# Patient Record
Sex: Female | Born: 1995 | Race: White | Hispanic: No | Marital: Single | Smoking: Never smoker
Health system: Southern US, Community
[De-identification: ages and names within clinical notes are randomized; demographics above are authoritative.]

## PROBLEM LIST (undated history)

## (undated) DIAGNOSIS — R001 Bradycardia, unspecified: Secondary | ICD-10-CM

## (undated) DIAGNOSIS — I499 Cardiac arrhythmia, unspecified: Secondary | ICD-10-CM

## (undated) HISTORY — PX: HAND SURGERY: SHX662

## (undated) HISTORY — DX: Bradycardia, unspecified: R00.1

## (undated) HISTORY — PX: APPENDECTOMY: SHX54

## (undated) HISTORY — DX: Cardiac arrhythmia, unspecified: I49.9

---
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
## 2018-01-11 DIAGNOSIS — K3580 Unspecified acute appendicitis: Secondary | ICD-10-CM | POA: Insufficient documentation

## 2021-11-29 ENCOUNTER — Ambulatory Visit: Payer: Self-pay | Admitting: Nurse Practitioner

## 2021-11-29 ENCOUNTER — Encounter: Payer: Self-pay | Admitting: Advanced Practice Midwife

## 2021-11-29 DIAGNOSIS — Z1388 Encounter for screening for disorder due to exposure to contaminants: Secondary | ICD-10-CM | POA: Diagnosis not present

## 2021-11-29 DIAGNOSIS — Z0389 Encounter for observation for other suspected diseases and conditions ruled out: Secondary | ICD-10-CM | POA: Diagnosis not present

## 2021-11-29 DIAGNOSIS — Z3009 Encounter for other general counseling and advice on contraception: Secondary | ICD-10-CM | POA: Diagnosis not present

## 2021-11-29 DIAGNOSIS — Z113 Encounter for screening for infections with a predominantly sexual mode of transmission: Secondary | ICD-10-CM | POA: Diagnosis not present

## 2021-11-29 LAB — WET PREP FOR TRICH, YEAST, CLUE
Trichomonas Exam: NEGATIVE
Yeast Exam: NEGATIVE

## 2021-11-29 NOTE — Progress Notes (Signed)
[facility] Department  STI clinic/screening visit [place] [address] [phone]  Subjective:  Tobie A Pichette is a 26 y.o. female being seen today for an STI screening visit. The patient reports they do not have symptoms.  Patient reports that they do not desire a pregnancy in the next year.   They reported they are not interested in discussing contraception today.    Patient's last menstrual period was 11/10/2021 (exact date).   Patient has the following medical conditions:  There are no problems to display for this patient.   Chief Complaint  Patient presents with   SEXUALLY TRANSMITTED DISEASE    HPI  Patient reports to clinic today for STD screening.  Patient reports being asymptomatic.    Last HIV test per patient/review of record was: Never  Patient reports last pap was: Never   Screening for MPX risk: Does the patient have an unexplained rash? No Is the patient MSM? No Does the patient endorse multiple sex partners or anonymous sex partners? No Did the patient have close or sexual contact with a person diagnosed with MPX? No Has the patient traveled outside the [place] where MPX is endemic? No Is there a high clinical suspicion for MPX-- evidenced by one of the following No  -Unlikely to be chickenpox  -Lymphadenopathy  -Rash that present in same phase of evolution on any given body part See flowsheet for further details and programmatic requirements.   Immunization history:  Immunization History  Administered Date(s) Administered   HPV Quadrivalent 08/15/2008, 10/16/2008, 11/04/2009   Hepatitis A 10/13/2005, 04/11/2006   Hepatitis B 1997-08-04, 08/22/1997, 12/30/1997   MMR 09/28/1998, 06/03/2002   Meningococcal Mcv4o 09/28/2012, 12/13/2013   Tdap 08/15/2008   Varicella 06/26/1998, 08/15/2008     The following portions of the patient's history were reviewed and updated as appropriate: allergies, current medications, past  medical history, past social history, past surgical history and problem list.  Objective:  There were no vitals filed for this visit.  Physical Exam Constitutional:      Appearance: Normal appearance.  HENT:     Head: Normocephalic. No abrasion, masses or laceration. Hair is normal.     Right Ear: External ear normal.     Left Ear: External ear normal.     Nose: Nose normal.     Mouth/Throat:     Lips: Pink.     Mouth: Mucous membranes are moist. No oral lesions.     Pharynx: No oropharyngeal exudate or posterior oropharyngeal erythema.     Tonsils: No tonsillar exudate or tonsillar abscesses.     Comments: No visible signs of dental caries  Eyes:     General: Lids are normal.        Right eye: No discharge.        Left eye: No discharge.     Conjunctiva/sclera: Conjunctivae normal.     Right eye: No exudate.    Left eye: No exudate. Abdominal:     General: Abdomen is flat.     Palpations: Abdomen is soft.     Tenderness: There is no abdominal tenderness. There is no rebound.  Genitourinary:    Pubic Area: No rash or pubic lice.      Labia:        Right: No rash, tenderness, lesion or injury.        Left: No rash, tenderness, lesion or injury.      Vagina: Normal. No vaginal discharge, erythema or lesions.  Cervix: No cervical motion tenderness, discharge, lesion or erythema.     Uterus: Not enlarged and not tender.      Rectum: Normal.     Comments: Amount Discharge: small  Odor: No pH: less than 4.5 Adheres to vaginal wall: No Color:  color of discharge matches the white swab  Musculoskeletal:     Cervical back: Full passive range of motion without pain, normal range of motion and neck supple.  Lymphadenopathy:     Cervical: No cervical adenopathy.     Right cervical: No superficial, deep or posterior cervical adenopathy.    Left cervical: No superficial, deep or posterior cervical adenopathy.     Upper Body:     Right upper body: No supraclavicular, axillary  or epitrochlear adenopathy.     Left upper body: No supraclavicular, axillary or epitrochlear adenopathy.     Lower Body: No right inguinal adenopathy. No left inguinal adenopathy.  Skin:    General: Skin is warm and dry.     Findings: No lesion or rash.  Neurological:     Mental Status: She is alert and oriented to person, place, and time.  Psychiatric:        Attention and Perception: Attention normal.        Mood and Affect: Mood normal.        Speech: Speech normal.        Behavior: Behavior normal. Behavior is cooperative.      Assessment and Plan:  Avey A Dethloff is a 26 y.o. female presenting to the [facility] for STI screening  1. Screening examination for venereal disease -26 year old female in clinic today for STD screening. -Patient accepted all screenings including oral GC, vaginal CT/GC, wet prep and declines bloodwork for HIV/RPR.  Patient meets criteria for HepB screening? No. Ordered? No - low risk  Patient meets criteria for HepC screening? Yes. Ordered? No - refused   Treat wet prep per standing order Discussed time line for [facility] results and that patient will be called with positive results and encouraged patient to call if she had not heard in 2 weeks.  Counseled to return or seek care for continued or worsening symptoms Recommended condom use with all sex  Patient is currently not using  contraception   to prevent pregnancy.    - WET PREP FOR TRICH, YEAST, CLUE - Gonococcus culture - Chlamydia/Gonorrhea [facility]   Total time spent: 30 minutes   Return if symptoms worsen or fail to improve.    Ayo White, FNP  

## 2021-11-29 NOTE — Progress Notes (Signed)
Wet mount reviewed with provider during clinic visit - no treatment indicated.   Al Decant, RN

## 2021-12-03 LAB — GONOCOCCUS CULTURE

## 2022-01-27 ENCOUNTER — Ambulatory Visit
Admission: EM | Admit: 2022-01-27 | Discharge: 2022-01-27 | Disposition: A | Discharge status: Home / Self Care | Payer: BC Managed Care – PPO | Attending: Urgent Care | Admitting: Urgent Care

## 2022-01-27 DIAGNOSIS — D692 Other nonthrombocytopenic purpura: Secondary | ICD-10-CM | POA: Insufficient documentation

## 2022-01-27 DIAGNOSIS — D72828 Other elevated white blood cell count: Secondary | ICD-10-CM | POA: Insufficient documentation

## 2022-01-27 DIAGNOSIS — R112 Nausea with vomiting, unspecified: Secondary | ICD-10-CM | POA: Insufficient documentation

## 2022-01-27 LAB — COMPREHENSIVE METABOLIC PANEL
ALT: 14 U/L (ref 0–44)
AST: 13 U/L — ABNORMAL LOW (ref 15–41)
Albumin: 3.9 g/dL (ref 3.5–5.0)
Alkaline Phosphatase: 50 U/L (ref 38–126)
Anion gap: 5 (ref 5–15)
BUN: 9 mg/dL (ref 6–20)
CO2: 26 mmol/L (ref 22–32)
Calcium: 9 mg/dL (ref 8.9–10.3)
Chloride: 106 mmol/L (ref 98–111)
Creatinine, Ser: 0.8 mg/dL (ref 0.44–1.00)
GFR, Estimated: >60 mL/min (ref >60)
Glucose, Bld: 98 mg/dL (ref 70–99)
Potassium: 3.9 mmol/L (ref 3.5–5.1)
Sodium: 137 mmol/L (ref 135–145)
Total Bilirubin: 0.6 mg/dL (ref 0.3–1.2)
Total Protein: 8 g/dL (ref 6.5–8.1)

## 2022-01-27 LAB — URINALYSIS, ROUTINE W REFLEX MICROSCOPIC
Bilirubin Urine: NEGATIVE
Glucose, UA: NEGATIVE mg/dL
Hgb urine dipstick: NEGATIVE
Ketones, ur: NEGATIVE mg/dL
Leukocytes,Ua: NEGATIVE
Nitrite: NEGATIVE
Protein, ur: NEGATIVE mg/dL
Specific Gravity, Urine: 1.02 (ref 1.005–1.030)
pH: 6.5 (ref 5.0–8.0)

## 2022-01-27 LAB — CBC WITH DIFFERENTIAL/PLATELET
Abs Immature Granulocytes: 0.04 10*3/uL (ref 0.00–0.07)
Basophils Absolute: 0.1 10*3/uL (ref 0.0–0.1)
Basophils Relative: 1 %
Eosinophils Absolute: 0.5 10*3/uL (ref 0.0–0.5)
Eosinophils Relative: 4 %
HCT: 40.5 % (ref 36.0–46.0)
Hemoglobin: 13.9 g/dL (ref 12.0–15.0)
Immature Granulocytes: 0 %
Lymphocytes Relative: 22 %
Lymphs Abs: 2.6 10*3/uL (ref 0.7–4.0)
MCH: 32.3 pg (ref 26.0–34.0)
MCHC: 34.3 g/dL (ref 30.0–36.0)
MCV: 94.2 fL (ref 80.0–100.0)
Monocytes Absolute: 0.7 10*3/uL (ref 0.1–1.0)
Monocytes Relative: 6 %
Neutro Abs: 7.7 10*3/uL (ref 1.7–7.7)
Neutrophils Relative %: 67 %
Platelets: 417 10*3/uL — ABNORMAL HIGH (ref 150–400)
RBC: 4.3 MIL/uL (ref 3.87–5.11)
RDW: 12.2 % (ref 11.5–15.5)
WBC: 11.6 10*3/uL — ABNORMAL HIGH (ref 4.0–10.5)
nRBC: 0 % (ref 0.0–0.2)

## 2022-01-27 MED ORDER — PREDNISONE 10 MG (21) PO TBPK: ORAL_TABLET | Freq: Every day | ORAL | 0 refills | Status: DC

## 2022-01-27 MED ORDER — ONDANSETRON HCL 4 MG PO TABS: 4.0000 mg | ORAL_TABLET | Freq: Three times a day (TID) | ORAL | 0 refills | Status: DC | PRN

## 2022-01-27 NOTE — ED Provider Notes (Signed)
[facility]    CSN: [id number] Arrival date & time: 01/27/22  1125      History   Chief Complaint Chief Complaint  Patient presents with   Insect Bite    HPI Cindy Cunningham is a 26 y.o. female.   Pleasant 26 year old female presents today due to concerns of a weird rash to her right hip.  States she was in the [place] when it happened.  She is uncertain what caused it.  She is under the impression she was bit by an insect, but cannot be certain.  She did not see the offending agent.  Since Saturday, has had a red blanchable rash to the right hip.  It has not increased in size.  It does not itch, but is intermittently painful.  Starting this morning, she started having some nausea and vomited once.  She denies a fever.  She is currently on doxycycline for a dental infection and has 6 days left.     History reviewed. No pertinent past medical history.  There are no problems to display for this patient.   Past Surgical History:  Procedure Laterality Date   APPENDECTOMY     HAND SURGERY Right     OB History   No obstetric history on file.      Home Medications    Prior to Admission medications   Medication Sig Start Date End Date Taking? Authorizing Provider  doxycycline (VIBRA-TABS) 100 MG tablet Take 100 mg by mouth 2 (two) times daily. 01/25/22  Yes [provider]  ondansetron (ZOFRAN) 4 MG tablet Take 1 tablet (4 mg total) by mouth every 8 (eight) hours as needed for nausea or vomiting. 01/27/22  Yes Rockney Grenz L, PA  predniSONE (STERAPRED UNI-PAK 21 TAB) 10 MG (21) TBPK tablet Take by mouth daily. Take 6 tabs by mouth daily  for 1 days, then 5 tabs for 1 days, then 4 tabs for 1 days, then 3 tabs for 1 days, 2 tabs for 1 days, then 1 tab by mouth daily for 1 days 01/27/22  Yes Taylorann Tkach L, PA    Family History History reviewed. No pertinent family history.  Social History Social History   Tobacco Use   Smoking status: Never     Passive exposure: Never   Smokeless tobacco: Never  Vaping Use   Vaping Use: Every day   Substances: Nicotine, Flavoring  Substance Use Topics   Alcohol use: Yes    Comment: three times a month   Drug use: Yes    Types: Marijuana    Comment: twice a month     Allergies   Ibuprofen   Review of Systems Review of Systems As per HPI  Physical Exam Triage Vital Signs ED Triage Vitals  Enc Vitals Group     BP 01/27/22 1154 108/65     Pulse Rate 01/27/22 1154 64     Resp 01/27/22 1154 18     Temp 01/27/22 1154 98.2 F (36.8 C)     Temp Source 01/27/22 1154 Oral     SpO2 01/27/22 1154 100 %     Weight 01/27/22 1152 140 lb (63.5 kg)     Height 01/27/22 1152 5\' 2"  (1.575 m)     Head Circumference --      Peak Flow --      Pain Score 01/27/22 1152 7     Pain Loc --      Pain Edu? --      Excl.  in GC? --    No data found.  Updated Vital Signs BP 108/65 (BP Location: Left Arm)   Pulse 64   Temp 98.2 F (36.8 C) (Oral)   Resp 18   Ht 5\' 2"  (1.575 m)   Wt 140 lb (63.5 kg)   LMP 01/06/2022   SpO2 100%   BMI 25.61 kg/m   Visual Acuity Right Eye Distance:   Left Eye Distance:   Bilateral Distance:    Right Eye Near:   Left Eye Near:    Bilateral Near:     Physical Exam Vitals and nursing note reviewed. Exam conducted with a chaperone present.  Constitutional:      General: She is not in acute distress.    Appearance: Normal appearance. She is normal weight. She is not ill-appearing or toxic-appearing.  HENT:     Head: Normocephalic and atraumatic.  Cardiovascular:     Rate and Rhythm: Normal rate.  Pulmonary:     Effort: Pulmonary effort is normal. No respiratory distress.  Abdominal:     General: Abdomen is flat. Bowel sounds are normal. There is no distension.     Palpations: Abdomen is soft. There is no mass.     Tenderness: There is no abdominal tenderness. There is no right CVA tenderness, left CVA tenderness, guarding or rebound.     Hernia: No  hernia is present.  Skin:    General: Skin is warm and dry.     Capillary Refill: Capillary refill takes less than 2 seconds.     Coloration: Skin is not jaundiced.     Findings: Erythema (see photo - large purpuric area to R lateral buttock, painful to touch, blanchable, mildly raised) present.  Neurological:     General: No focal deficit present.     Mental Status: She is alert and oriented to person, place, and time.     Sensory: No sensory deficit.     Motor: No weakness.      UC Treatments / Results  Labs (all labs ordered are listed, but only abnormal results are displayed) Labs Reviewed  CBC WITH DIFFERENTIAL/PLATELET - Abnormal; Notable for the following components:      Result Value   WBC 11.6 (*)    Platelets 417 (*)    All other components within normal limits  COMPREHENSIVE METABOLIC PANEL - Abnormal; Notable for the following components:   AST 13 (*)    All other components within normal limits  URINALYSIS, ROUTINE W REFLEX MICROSCOPIC    EKG   Radiology No results found.  Procedures Procedures (including critical care time)  Medications Ordered in UC Medications - No data to display  Initial Impression / Assessment and Plan / UC Course  I have reviewed the triage vital signs and the nursing notes.  Pertinent labs & imaging results that were available during my care of the patient were reviewed by me and considered in my medical decision making (see chart for details).     Purpura -patient only has 1 single area of purpura, given her GI symptoms Henoch-Schnlein purpura is a possibility, however patient does not have any rash to distal extremities.  GI symptoms x 1 day.  UA obtained to rule out proteinuria and hematuria in the urine.  UA negative.  CBC also obtained, does not show thrombocytopenia.  Cause unknown at this time, will start prednisone x 6 days.  Strict ER precautions reviewed with patient. Leukocytosis -likely secondary to patient's  concurrent dental infection.  She  remains on doxycycline. Nausea and vomiting -benign abdominal exam in office today.  Discussed with patient this could be secondary to #1, may also however be a completely separate entity.  Will give patient Zofran for symptomatic support.  Patient understands should symptoms persist or worsen, head To the emergency room.   Final Clinical Impressions(s) / UC Diagnoses   Final diagnoses:  Purpura (HCC)  Other elevated white blood cell (WBC) count  Nausea and vomiting, unspecified vomiting type     Discharge Instructions      Your rash is called purpura, which is basically related to blood under the skin from a damaged blood vessel. There are many things that can cause this. Your urine is normal. You have a slightly elevated white blood cell count which is likely related to your dental infection. Please monitor for any new purpuric areas. Take the prednisone as prescribed today. If you continue to have nausea or vomiting, take the zofran as prescribed. Please establish care with a PCP or return to our office for a follow-up if this rash persists greater then 1 to 2 weeks. If you develop any new or worsening symptoms return to clinic sooner or head to the emergency room.     ED Prescriptions     Medication Sig Dispense Auth. Provider   predniSONE (STERAPRED UNI-PAK 21 TAB) 10 MG (21) TBPK tablet Take by mouth daily. Take 6 tabs by mouth daily  for 1 days, then 5 tabs for 1 days, then 4 tabs for 1 days, then 3 tabs for 1 days, 2 tabs for 1 days, then 1 tab by mouth daily for 1 days 21 tablet Kratos Ruscitti L, PA   ondansetron (ZOFRAN) 4 MG tablet Take 1 tablet (4 mg total) by mouth every 8 (eight) hours as needed for nausea or vomiting. 20 tablet Heydi Swango L, [place]      PDMP not reviewed this encounter.   Maretta Bees, [place] 01/27/22 1300

## 2022-01-27 NOTE — Discharge Instructions (Addendum)
Your rash is called purpura, which is basically related to blood under the skin from a damaged blood vessel. There are many things that can cause this. Your urine is normal. You have a slightly elevated white blood cell count which is likely related to your dental infection. Please monitor for any new purpuric areas. Take the prednisone as prescribed today. If you continue to have nausea or vomiting, take the zofran as prescribed. Please establish care with a PCP or return to our office for a follow-up if this rash persists greater then 1 to 2 weeks. If you develop any new or worsening symptoms return to clinic sooner or head to the emergency room.

## 2022-01-27 NOTE — ED Triage Notes (Signed)
Pt was seen at the dentist and was given doxycycline and has 6 days left

## 2022-01-27 NOTE — ED Triage Notes (Signed)
Pt is with her best friend  Pt recently returned from a cruise on Monday from the [place] and now has a rash along the right hip.   Pt states that there was a blister along the top of the rash.  Pt believes it was a brown recluse bite and is now experiencing pain and tenderness.  Pt states that when she first noticed the bite on Saturday she had cold chills and nausea.   Pt outlined it last night and states that it hasn't grown in size since last night.

## 2022-07-30 DIAGNOSIS — Z419 Encounter for procedure for purposes other than remedying health state, unspecified: Secondary | ICD-10-CM | POA: Diagnosis not present

## 2022-08-29 DIAGNOSIS — Z419 Encounter for procedure for purposes other than remedying health state, unspecified: Secondary | ICD-10-CM | POA: Diagnosis not present

## 2022-09-29 DIAGNOSIS — Z419 Encounter for procedure for purposes other than remedying health state, unspecified: Secondary | ICD-10-CM | POA: Diagnosis not present

## 2022-10-30 DIAGNOSIS — Z419 Encounter for procedure for purposes other than remedying health state, unspecified: Secondary | ICD-10-CM | POA: Diagnosis not present

## 2022-11-29 ENCOUNTER — Ambulatory Visit
Admission: EM | Admit: 2022-11-29 | Discharge: 2022-11-29 | Disposition: A | Discharge status: Home / Self Care | Payer: BC Managed Care – PPO | Attending: Emergency Medicine | Admitting: Emergency Medicine

## 2022-11-29 DIAGNOSIS — R55 Syncope and collapse: Secondary | ICD-10-CM | POA: Diagnosis not present

## 2022-11-29 DIAGNOSIS — R531 Weakness: Secondary | ICD-10-CM | POA: Insufficient documentation

## 2022-11-29 DIAGNOSIS — Z419 Encounter for procedure for purposes other than remedying health state, unspecified: Secondary | ICD-10-CM | POA: Diagnosis not present

## 2022-11-29 LAB — CBC WITH DIFFERENTIAL/PLATELET
Abs Immature Granulocytes: 0.03 10*3/uL (ref 0.00–0.07)
Basophils Absolute: 0.1 10*3/uL (ref 0.0–0.1)
Basophils Relative: 1 %
Eosinophils Absolute: 0.3 10*3/uL (ref 0.0–0.5)
Eosinophils Relative: 3 %
HCT: 40.7 % (ref 36.0–46.0)
Hemoglobin: 14 g/dL (ref 12.0–15.0)
Immature Granulocytes: 0 %
Lymphocytes Relative: 27 %
Lymphs Abs: 2.4 10*3/uL (ref 0.7–4.0)
MCH: 32.8 pg (ref 26.0–34.0)
MCHC: 34.4 g/dL (ref 30.0–36.0)
MCV: 95.3 fL (ref 80.0–100.0)
Monocytes Absolute: 0.4 10*3/uL (ref 0.1–1.0)
Monocytes Relative: 5 %
Neutro Abs: 5.8 10*3/uL (ref 1.7–7.7)
Neutrophils Relative %: 64 %
Platelets: 302 10*3/uL (ref 150–400)
RBC: 4.27 MIL/uL (ref 3.87–5.11)
RDW: 12.3 % (ref 11.5–15.5)
WBC: 8.9 10*3/uL (ref 4.0–10.5)
nRBC: 0 % (ref 0.0–0.2)

## 2022-11-29 LAB — COMPREHENSIVE METABOLIC PANEL
ALT: 17 U/L (ref 0–44)
AST: 15 U/L (ref 15–41)
Albumin: 4.2 g/dL (ref 3.5–5.0)
Alkaline Phosphatase: 41 U/L (ref 38–126)
Anion gap: 6 (ref 5–15)
BUN: 9 mg/dL (ref 6–20)
CO2: 27 mmol/L (ref 22–32)
Calcium: 8.7 mg/dL — ABNORMAL LOW (ref 8.9–10.3)
Chloride: 104 mmol/L (ref 98–111)
Creatinine, Ser: 0.76 mg/dL (ref 0.44–1.00)
GFR, Estimated: >60 mL/min (ref >60)
Glucose, Bld: 95 mg/dL (ref 70–99)
Potassium: 3.8 mmol/L (ref 3.5–5.1)
Sodium: 137 mmol/L (ref 135–145)
Total Bilirubin: 0.6 mg/dL (ref 0.3–1.2)
Total Protein: 7.7 g/dL (ref 6.5–8.1)

## 2022-11-29 NOTE — ED Provider Notes (Signed)
[facility]    CSN: [id number] Arrival date & time: 11/29/22  1116      History   Chief Complaint Chief Complaint  Patient presents with   Weakness   Loss of Consciousness    HPI Cindy Cunningham is a 27 y.o. female.   HPI  27 year old female with no significant past medical history presents for evaluation of feeling weak and reports a syncopal event yesterday.  She reports that she started her menstrual cycle yesterday and typically she has a loss of uterine cramping and nausea associated with her menses.  She also indicates that the flow is heavy and she does pass clots.  She changes her pad approximately 6 times a day.  Yesterday morning her menstrual cycle started at around 10 AM.  She was driving her roommate to work and around 4, reports that she had not eaten anything, and reports that she began to feel nauseous.  She pulled over into a parking lot and had several episodes of dry heaving but no actual vomiting.  She began feeling weak and was able to make it back to her car.  Her mom came and found her pale and diaphoretic.  EMS was called but did not transport her to the ER.  She went via POV with her mother but left before being seen after triage.  She has not had any more syncopal or presyncopal events but she does endorse feeling weak.  History reviewed. No pertinent past medical history.  There are no problems to display for this patient.   Past Surgical History:  Procedure Laterality Date   APPENDECTOMY     HAND SURGERY Right     OB History   No obstetric history on file.      Home Medications    Prior to Admission medications   Not on File    Family History History reviewed. No pertinent family history.  Social History Social History   Tobacco Use   Smoking status: Never    Passive exposure: Never   Smokeless tobacco: Never  Vaping Use   Vaping status: Every Day   Substances: Nicotine, Flavoring  Substance Use Topics   Alcohol  use: Yes    Comment: three times a month   Drug use: Yes    Types: Marijuana    Comment: twice a month     Allergies   Ibuprofen   Review of Systems Review of Systems  Gastrointestinal:  Positive for abdominal pain and nausea. Negative for vomiting.  Neurological:  Positive for syncope and weakness.     Physical Exam Triage Vital Signs ED Triage Vitals [11/29/22 1139]  Encounter Vitals Group     BP      Systolic BP Percentile      Diastolic BP Percentile      Pulse      Resp 16     Temp      Temp Source Oral     SpO2      Weight      Height      Head Circumference      Peak Flow      Pain Score      Pain Loc      Pain Education      Exclude from Growth Chart    No data found.  Updated Vital Signs BP 112/70 (BP Location: Left Arm)   Pulse 66   Temp 98.9 F (37.2 C) (Oral)   Resp 16   Ht  5\' 3"  (1.6 m)   Wt 160 lb (72.6 kg)   LMP 11/28/2022 (Exact Date)   SpO2 96%   BMI 28.34 kg/m   Visual Acuity Right Eye Distance:   Left Eye Distance:   Bilateral Distance:    Right Eye Near:   Left Eye Near:    Bilateral Near:     Physical Exam Vitals and nursing note reviewed.  Constitutional:      Appearance: Normal appearance. She is not ill-appearing.  HENT:     Head: Normocephalic and atraumatic.  Eyes:     Extraocular Movements: Extraocular movements intact.     Pupils: Pupils are equal, round, and reactive to light.     Comments: Bilateral conjunctiva are pale.  Cardiovascular:     Rate and Rhythm: Normal rate and regular rhythm.     Pulses: Normal pulses.     Heart sounds: Normal heart sounds. No murmur heard.    No friction rub. No gallop.  Pulmonary:     Effort: Pulmonary effort is normal.     Breath sounds: Normal breath sounds. No wheezing, rhonchi or rales.  Skin:    General: Skin is warm and dry.     Capillary Refill: Capillary refill takes less than 2 seconds.  Neurological:     General: No focal deficit present.     Mental Status:  She is alert and oriented to person, place, and time.      UC Treatments / Results  Labs (all labs ordered are listed, but only abnormal results are displayed) Labs Reviewed  COMPREHENSIVE METABOLIC PANEL - Abnormal; Notable for the following components:      Result Value   Calcium 8.7 (*)    All other components within normal limits  CBC WITH DIFFERENTIAL/PLATELET    EKG Sinus bradycardia with sinus arrhythmia Ventricular rate 59 bpm PR interval 170 ms QRS duration 76 ms QT/QTc 402/397 ms There is a flattening of the ST segment in V1 and V2.  No other ST or T wave abnormalities noted.  Radiology No results found.  Procedures Procedures (including critical care time)  Medications Ordered in UC Medications - No data to display  Initial Impression / Assessment and Plan / UC Course  I have reviewed the triage vital signs and the nursing notes.  Pertinent labs & imaging results that were available during my care of the patient were reviewed by me and considered in my medical decision making (see chart for details).   Patient is a nontoxic-appearing 27 year old female presenting for evaluation of ongoing weakness in the setting of a recent syncopal episode.  She denies having syncope in the past.  She states that she is experiencing some uterine cramping, which is not atypical for menstrual cycle.  She started her menstrual cycle yesterday.  She states that she is having a normal flow and is changing her pad approximately 6 times a day.  She is passing some clots but not more than usual.  In addition to starting her menstrual cycle yesterday she reports that she did not eat anything up and to the point where she was taking her friend to work and around 4 PM.  During her ride to work she began feeling nauseous and had an episode of dry heaving but no actual vomiting.  She was evaluated by EMS and cleared to go to the ER via POV but left before being seen.  Her physical exam today  reveals mildly pale conjunctiva bilaterally.  The patient skin is  warm and dry.  Cardiopulmonary exam reveals S1-S2 heart sounds with regular rate and rhythm and lung sounds are clear to auscultation in all fields.  Differential diagnoses include pain response, vasovagal syncope, arrhythmia, hypoglycemia, or anemia.  I will order a CBC, CMP, and EKG.  Patient's EKG shows sinus bradycardia with sinus arrhythmia.  There is a flattening of the ST segment in V1 and V2 without any other T wave or ST abnormalities noted.  No additional tracings available for comparison in epic.  CBC shows a normal H&H of 14.0 and 40.7.  Platelets are also normal at 302.  CMP shows normal electrolytes, renal function, and transaminases.  Patient has a mildly decreased calcium of 8.7.  The etiology of the patient's syncopal/near syncopal event yesterday is unclear though hypoglycemia and anemia are unlikely given the patient's reassuring blood work.  Given her sinus bradycardia with sinus arrhythmia, which may be a normal variant, it is possible that the syncopal event could be cardiogenic in nature or vasovagal in relation to pain response from starting her menstrual cycle.  In any event, I will refer the patient to cardiology.  I will also have staff set her up with a primary care provider prior to discharge so that she has someone to follow-up with.  I did discuss with the patient that if she has a return of symptoms, as she is getting ready to go to [place] tomorrow for work, that she should call 911 and go to the ER.  Final Clinical Impressions(s) / UC Diagnoses   Final diagnoses:  Near syncope  Weakness     Discharge Instructions      Your blood work was reassuring and did not show any signs of electrolyte abnormality, anemia, or diabetes.  Your EKG did show that you have a slow heart rate.  I have made a referral to cardiology for further evaluation of your symptoms should they continue.  Our staff is also  help to establish a PCP at discharge.  You need to keep your new patient appointment for follow-up and further evaluation for new or continued symptoms.  If you have a recurrence of passing out, develop chest pain, dizziness, blurry vision, or heart racing you need to call 911 and go to the ER.     ED Prescriptions   None    PDMP not reviewed this encounter.   Becky Augusta, NP 11/29/22 1240

## 2022-11-29 NOTE — ED Triage Notes (Signed)
Pt c/o weakness since last night. States she lost consciousness in her car for about 40 mins before EMS & mom arrived. Went to [facility] but LWBS after triage due to long wait time. Started her period yesterday.

## 2022-11-29 NOTE — Discharge Instructions (Addendum)
Your blood work was reassuring and did not show any signs of electrolyte abnormality, anemia, or diabetes.  Your EKG did show that you have a slow heart rate.  I have made a referral to cardiology for further evaluation of your symptoms should they continue.  Our staff is also help to establish a PCP at discharge.  You need to keep your new patient appointment for follow-up and further evaluation for new or continued symptoms.  If you have a recurrence of passing out, develop chest pain, dizziness, blurry vision, or heart racing you need to call 911 and go to the ER.

## 2022-12-30 DIAGNOSIS — Z419 Encounter for procedure for purposes other than remedying health state, unspecified: Secondary | ICD-10-CM | POA: Diagnosis not present

## 2023-01-13 ENCOUNTER — Ambulatory Visit (INDEPENDENT_AMBULATORY_CARE_PROVIDER_SITE_OTHER): Payer: BC Managed Care – PPO | Admitting: Physician Assistant

## 2023-01-13 ENCOUNTER — Encounter: Payer: Self-pay | Admitting: Physician Assistant

## 2023-01-13 VITALS — BP 98/62 | HR 95 | Ht 63.0 in | Wt 163.0 lb

## 2023-01-13 DIAGNOSIS — R55 Syncope and collapse: Secondary | ICD-10-CM

## 2023-01-13 DIAGNOSIS — R6889 Other general symptoms and signs: Secondary | ICD-10-CM | POA: Diagnosis not present

## 2023-01-13 DIAGNOSIS — I781 Nevus, non-neoplastic: Secondary | ICD-10-CM | POA: Diagnosis not present

## 2023-01-13 DIAGNOSIS — Z3009 Encounter for other general counseling and advice on contraception: Secondary | ICD-10-CM

## 2023-01-13 DIAGNOSIS — R202 Paresthesia of skin: Secondary | ICD-10-CM

## 2023-01-13 NOTE — Progress Notes (Addendum)
Date:  01/13/2023   Name:  Cindy Cunningham   DOB:  Mar 30, 1995   MRN:  [id number]   Chief Complaint: Establish Care, Dizziness (X 2-3 days, When standing for a while gets off balance, when standing up to fast, TV looks like it is shaking when watching tv), Tingling (In arms and legs when sitting on the toilet for to long or when raising hands), and redness (Referral to derm for spots on face )  HPI Cindy Cunningham is a very pleasant 27 year old female new to the practice today to establish care, brings a variety of mild complaints to discuss today.  Near syncope 11/29/2022 after going nearly all day without calories, seen in urgent care and I have reviewed this note.  CBC and CMP normal, EKG with borderline bradycardia and sinus arrhythmia.  No episodes since then.  Sees cardiology next Tuesday, wonders if any additional lab testing needs to be done.  Reports sometimes when she changes position such as from lying down to standing, she gets tunnel vision.  Also says she had one episode while seated that looked like the TV was shaking but blinked a few times and this spontaneously resolved.  Reports cold intolerance.  Always cold, especially in the periphery.  Even in warm settings she often feels cold.  History of thyroid problems in the family.  Endorses tingling sensations of the hands when she puts both hands behind her head lying down.  Tingling sensation in the feet when sitting on the toilet especially for longer periods of time.  Says mom has similar.  Couple red spots on the face present for years, wonders if she needs dermatology referral.  New relationship, wondering about birth control.  At the same time, she declines essentially all forms of birth control.  Generally speaking, her lifestyle is not the best.  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  Does not take a multivitamin.  Does not have many fruits and vegetables in the  diet.  Medication list has been reviewed and updated.  No outpatient medications have been marked as taking for the 01/13/23 encounter (Office Visit) with Remo Lipps, PA.     Review of Systems  Patient Active Problem List   Diagnosis Date Noted   Acute appendicitis 01/11/2018   Trigger finger, acquired 04/30/2013    Allergies  Allergen Reactions   Ibuprofen Swelling    Immunization History  Administered Date(s) Administered   HPV Quadrivalent 08/15/2008, 10/16/2008, 11/04/2009   Hepatitis A 10/13/2005, 04/11/2006   Hepatitis B 08-15-1997, 08/22/1997, 12/30/1997   Influenza,inj,Quad PF,6+ Mos 01/12/2018   MMR 09/28/1998, 06/03/2002   Meningococcal Mcv4o 09/28/2012, 12/13/2013   Tdap 08/15/2008   Varicella 06/26/1998, 08/15/2008    Past Surgical History:  Procedure Laterality Date   APPENDECTOMY     HAND SURGERY Right     Social History   Tobacco Use   Smoking status: Never    Passive exposure: Never   Smokeless tobacco: Never  Vaping Use   Vaping status: Every Day   Substances: Nicotine, Flavoring  Substance Use Topics   Alcohol use: Yes    Comment: three times a month   Drug use: Yes    Types: Marijuana    Comment: twice a month    Family History  Problem Relation Age of Onset   Hypertension Mother    Heart disease Mother    Heart disease Father  Hypertension Maternal Grandmother    Hypertension Maternal Grandfather    Diabetes Maternal Grandfather    Hypertension Paternal Grandmother    Diabetes Paternal Grandmother    Hypertension Paternal Grandfather          No data to display              No data to display          BP Readings from Last 3 Encounters:  01/13/23 98/62  11/29/22 112/70  01/27/22 108/65    Wt Readings from Last 3 Encounters:  01/13/23 163 lb (73.9 kg)  11/29/22 160 lb (72.6 kg)  01/27/22 140 lb (63.5 kg)    BP 98/62   Pulse 95   Ht 5\' 3"  (1.6 m)   Wt 163 lb (73.9 kg)   SpO2 98%   BMI 28.87  kg/m   Physical Exam Vitals and nursing note reviewed.  Constitutional:      Appearance: Normal appearance.  Neck:     Vascular: No carotid bruit.  Cardiovascular:     Rate and Rhythm: Normal rate and regular rhythm.     Heart sounds: No murmur heard.    No friction rub. No gallop.  Pulmonary:     Effort: Pulmonary effort is normal.     Breath sounds: Normal breath sounds.  Abdominal:     General: There is no distension.  Musculoskeletal:        General: Normal range of motion.  Skin:    General: Skin is warm and dry.     Comments: Two tiny spider angiomata of left cheek and nasal bridge  Neurological:     Mental Status: She is alert and oriented to person, place, and time.     Gait: Gait is intact.  Psychiatric:        Mood and Affect: Mood and affect normal.     Recent Labs     Component Value Date/Time   NA 137 11/29/2022 1203   K 3.8 11/29/2022 1203   CL 104 11/29/2022 1203   CO2 27 11/29/2022 1203   GLUCOSE 95 11/29/2022 1203   BUN 9 11/29/2022 1203   CREATININE 0.76 11/29/2022 1203   CALCIUM 8.7 (L) 11/29/2022 1203   PROT 7.7 11/29/2022 1203   ALBUMIN 4.2 11/29/2022 1203   AST 15 11/29/2022 1203   ALT 17 11/29/2022 1203   ALKPHOS 41 11/29/2022 1203   BILITOT 0.6 11/29/2022 1203   GFRNONAA >60 11/29/2022 1203    Lab Results  Component Value Date   WBC 8.9 11/29/2022   HGB 14.0 11/29/2022   HCT 40.7 11/29/2022   MCV 95.3 11/29/2022   PLT 302 11/29/2022   No results found for: "HGBA1C" No results found for: "CHOL", "HDL", "LDLCALC", "LDLDIRECT", "TRIG", "CHOLHDL" No results found for: "TSH"   Assessment and Plan:  1. Near syncope Patient reassured probably hypoglycemia versus general malaise from not eating on the day of the event.  Encouraged at least 2 meals per day and adequate hydration throughout the day.  Perhaps some element of orthostasis, but not clinically apparent.  Blood pressure borderline hypotensive today.  No tachycardia induced  when rising from seated to standing position.  Patient reassured I feel her cardiac workup on Tuesday will be negative.  She would most benefit from improvements to lifestyle including diet, sleep, physical activity.  2. Cold intolerance Check TSH especially with family history of thyroid issues. - TSH  3. Spider angioma Patient reassured of benign nature of these,  no need for further intervention.  Dermatology referral offered if she desires treatment for cosmesis, but deferred at this time.  4. Paresthesia Discussed with patient is relatively normal to experience these sensations in the context that she is describing.  It is possible that there may be some B vitamin deficiencies here contributing to the paresthesia, but management remains the same; encouraged nutritive diet +/- multivitamin  5. General counseling and advice on contraceptive management Reviewed options for contraception including OCPs (combined or POP), IUD, Nexplanon, contraceptive patches.  At this time she would not like any of these options, so we discussed the relatively high effectiveness of calendar planning as a means of contraception when done correctly, as long as her menses are regular (which they are).  Advised barrier method as additional means of contraception.     There appears to be some element of illness anxiety in this patient.  Reassured today.  Needs routine care moving forward.  Will monitor for now.  Patient to return within the month for complete physical exam with Pap (none prior).  Return in about 4 weeks (around 02/10/2023) for CPE w/ pap.    Alvester Morin, PA-C, DMSc, Nutritionist [facility] [phone]

## 2023-01-13 NOTE — Patient Instructions (Signed)
-  It was a pleasure to see you today! Please review your visit summary for helpful information -Lab results are usually available within 1-2 days and we will call once reviewed -I would encourage you to follow your care via MyChart where you can access lab results, notes, messages, and more -If you feel that we did a nice job today, please complete your after-visit survey and leave us a Google review! Your CMA today was Kieandra and your provider was Dan Waddell, PA-C, DMSc -Please return for follow-up in about 1 month  

## 2023-01-17 ENCOUNTER — Ambulatory Visit: Payer: BC Managed Care – PPO

## 2023-01-17 ENCOUNTER — Ambulatory Visit: Payer: BC Managed Care – PPO | Attending: Cardiology | Admitting: Cardiology

## 2023-01-17 ENCOUNTER — Encounter: Payer: Self-pay | Admitting: Cardiology

## 2023-01-17 VITALS — BP 106/70 | HR 52 | Ht 63.0 in | Wt 165.4 lb

## 2023-01-17 DIAGNOSIS — R001 Bradycardia, unspecified: Secondary | ICD-10-CM

## 2023-01-17 DIAGNOSIS — R55 Syncope and collapse: Secondary | ICD-10-CM

## 2023-01-17 NOTE — Patient Instructions (Signed)
Medication Instructions:   Your physician recommends that you continue on your current medications as directed. Please refer to the Current Medication list given to you today.  *If you need a refill on your cardiac medications before your next appointment, please call your pharmacy*   Lab Work:  None Ordered  If you have labs (blood work) drawn today and your tests are completely normal, you will receive your results only by: MyChart Message (if you have MyChart) OR A paper copy in the mail If you have any lab test that is abnormal or we need to change your treatment, we will call you to review the results.   Testing/Procedures:  Your physician has requested that you have an echocardiogram - in 3 weeks or later. Echocardiography is a painless test that uses sound waves to create images of your heart. It provides your doctor with information about the size and shape of your heart and how well your heart's chambers and valves are working. This procedure takes approximately one hour. There are no restrictions for this procedure. Please do NOT wear cologne, perfume, aftershave, or lotions (deodorant is allowed). Please arrive 15 minutes prior to your appointment time.  Please note: We ask at that you not bring children with you during ultrasound (echo/ vascular) testing. Due to room size and safety concerns, children are not allowed in the ultrasound rooms during exams. Our front office staff cannot provide observation of children in our lobby area while testing is being conducted. An adult accompanying a patient to their appointment will only be allowed in the ultrasound room at the discretion of the ultrasound technician under special circumstances. We apologize for any inconvenience.  Your physician has recommended that you wear a Zio monitor.   This monitor is a medical device that records the heart's electrical activity. Doctors most often use these monitors to diagnose arrhythmias.  Arrhythmias are problems with the speed or rhythm of the heartbeat. The monitor is a small device applied to your chest. You can wear one while you do your normal daily activities. While wearing this monitor if you have any symptoms to push the button and record what you felt. Once you have worn this monitor for the period of time provider prescribed (Usually 14 days), you will return the monitor device in the postage paid box. Once it is returned they will download the data collected and provide [place] with a report which the provider will then review and we will call you with those results. Important tips:  Avoid showering during the first 24 hours of wearing the monitor. Avoid excessive sweating to help maximize wear time. Do not submerge the device, no hot tubs, and no swimming pools. Keep any lotions or oils away from the patch. After 24 hours you may shower with the patch on. Take brief showers with your back facing the shower head.  Do not remove patch once it has been placed because that will interrupt data and decrease adhesive wear time. Push the button when you have any symptoms and write down what you were feeling. Once you have completed wearing your monitor, remove and place into box which has postage paid and place in your outgoing mailbox.  If for some reason you have misplaced your box then call our office and we can provide another box and/or mail it off for you.     Follow-Up: At [facility], you and your health needs are our priority.  As part of our continuing mission to  provide you with exceptional heart care, we have created designated Provider Care Teams.  These Care Teams include your primary Cardiologist (physician) and Advanced Practice Providers (APPs -  Physician Assistants and Nurse Practitioners) who all work together to provide you with the care you need, when you need it.  We recommend signing up for the patient portal called "MyChart".  Sign up information is  provided on this After Visit Summary.  MyChart is used to connect with patients for Virtual Visits (Telemedicine).  Patients are able to view lab/test results, encounter notes, upcoming appointments, etc.  Non-urgent messages can be sent to your provider as well.   To learn more about what you can do with MyChart, go to [email or web address].    Your next appointment:   8 - 10 week(s)  Provider:   You may see Debbe Odea, MD or one of the following Advanced Practice Providers on your designated Care Team:   Nicolasa Ducking, NP Eula Listen, PA-C Cadence Fransico Michael, PA-C Charlsie Quest, NP Carlos Levering, NP

## 2023-01-17 NOTE — Progress Notes (Signed)
Cardiology Office Note:    Date:  01/17/2023   ID:  Cindy Cunningham, DOB 01/05/1996, MRN [id number]  PCP:  Remo Lipps, PA   [facility] Providers Cardiologist:  Debbe Odea, MD     Referring MD: Becky Augusta, NP   Chief Complaint  Patient presents with   New Patient (Initial Visit)    Referred for cardiac evaluation of near syncope, sinus bradycardia, and sinus arrhythmia.  Seen in ED on 11/29/22 after near syncopal episode.  No further episodes.      Cindy Cunningham is a 27 y.o. female who is being seen today for the evaluation of dizziness at the request of Becky Augusta, NP.   History of Present Illness:    Cindy Cunningham is a 27 y.o. female with no significant past medical history who presents due to dizziness and presyncope.  States having an episode of dizziness, nausea last month while at the grocery store.  She walked into her car, and called her mom after sitting in the chair.  States having significant pain at the time from her menses.  Did not fully pass out but states she was almost at that point.  She has had previous episodes in the past at least 7 years now.  Symptoms sometimes associated with menses, although can occur randomly.  Mother took patient to ED, workup was unrevealing, sinus bradycardia with sinus arrhythmia noted, heart rate 59.  Was advised to follow-up with cardiology.  Denies chest pain, palpitations, shortness of breath.  Past Medical History:  Diagnosis Date   Bradycardia    Irregular heart beat     Past Surgical History:  Procedure Laterality Date   APPENDECTOMY     HAND SURGERY Right     Current Medications: No outpatient medications have been marked as taking for the 01/17/23 encounter (Office Visit) with Debbe Odea, MD.     Allergies:   Ibuprofen   Social History   Socioeconomic History   Marital status: Single    Spouse name: Not on file   Number of children: 0   Years of education: Not on  file   Highest education level: Not on file  Occupational History   Not on file  Tobacco Use   Smoking status: Never    Passive exposure: Never   Smokeless tobacco: Never  Vaping Use   Vaping status: Every Day   Substances: Nicotine, Flavoring  Substance and Sexual Activity   Alcohol use: Yes    Comment: three times a month   Drug use: Yes    Types: Marijuana    Comment: twice a month   Sexual activity: Yes    Birth control/protection: None  Other Topics Concern   Not on file  Social History Narrative   Not on file   Social Determinants of Health   Financial Resource Strain: Low Risk  (01/11/2018)   Received from [facility], [facility]   Overall Financial Resource Strain (CARDIA)    Difficulty of Paying Living Expenses: Not hard at all  Food Insecurity: Unknown (01/11/2018)   Received from [facility], [facility]   Hunger Vital Sign    Worried About Running Out of Food in the Last Year: Patient declined    Ran Out of Food in the Last Year: Patient declined  Transportation Needs: Unknown (01/11/2018)   Received from [facility], [facility]   [facility] - Transportation    Lack of Transportation (Medical): Patient  declined    Lack of Transportation (Non-Medical): Patient declined  Physical Activity: Not on file  Stress: Not on file  Social Connections: Not on file     Family History: The patient's family history includes Diabetes in her maternal grandfather and paternal grandmother; Heart disease in her father and mother; Hypertension in her maternal grandfather, maternal grandmother, mother, paternal grandfather, and paternal grandmother.  ROS:   Please see the history of present illness.     All other systems reviewed and are negative.  EKGs/Labs/Other Studies Reviewed:    The following studies were reviewed today:  EKG Interpretation Date/Time:  Tuesday January 17 2023 09:21:12 EST Ventricular Rate:  52 PR Interval:  168 QRS  Duration:  80 QT Interval:  406 QTC Calculation: 377 R Axis:   9  Text Interpretation: Sinus bradycardia with sinus arrhythmia Confirmed by Debbe Odea ([id number]) on 01/17/2023 9:30:39 AM    Recent Labs: 11/29/2022: ALT 17; BUN 9; Creatinine, Ser 0.76; Hemoglobin 14.0; Platelets 302; Potassium 3.8; Sodium 137  Recent Lipid Panel No results found for: "CHOL", "TRIG", "HDL", "CHOLHDL", "VLDL", "LDLCALC", "LDLDIRECT"   Risk Assessment/Calculations:             Physical Exam:    VS:  BP 106/70 (BP Location: Right Arm, Patient Position: Sitting, Cuff Size: Normal)   Pulse (!) 52   Ht 5\' 3"  (1.6 m)   Wt 165 lb 6.4 oz (75 kg)   SpO2 99%   BMI 29.30 kg/m     Wt Readings from Last 3 Encounters:  01/17/23 165 lb 6.4 oz (75 kg)  01/13/23 163 lb (73.9 kg)  11/29/22 160 lb (72.6 kg)     GEN:  Well nourished, well developed in no acute distress HEENT: Normal NECK: No JVD; No carotid bruits CARDIAC: RRR, no murmurs, rubs, gallops RESPIRATORY:  Clear to auscultation without rales, wheezing or rhonchi  ABDOMEN: Soft, non-tender, non-distended MUSCULOSKELETAL:  No edema; No deformity  SKIN: Warm and dry NEUROLOGIC:  Alert and oriented x 3 PSYCHIATRIC:  Normal affect   ASSESSMENT:    1. Pre-syncope   2. Sinus bradycardia    PLAN:    In order of problems listed above:  Presyncopal symptoms, appear vasovagal in etiology.  Orthostatic vitals today with no evidence for orthostasis.  Please cardiac monitor to evaluate any significant conduction abnormalities or arrhythmias.  Obtain echocardiogram.  Patient overall is low cardiac risk.  Safety precautions/sitting down  when prodromal symptoms occur where advised. Sinus bradycardia, cardiac monitor as above.  Appears benign.  Follow-up after cardiac testing      Medication Adjustments/Labs and Tests Ordered: Current medicines are reviewed at length with the patient today.  Concerns regarding medicines are outlined above.   Orders Placed This Encounter  Procedures   LONG TERM MONITOR (3-14 DAYS)   EKG 12-Lead   ECHOCARDIOGRAM COMPLETE   No orders of the defined types were placed in this encounter.   Patient Instructions  Medication Instructions:   Your physician recommends that you continue on your current medications as directed. Please refer to the Current Medication list given to you today.  *If you need a refill on your cardiac medications before your next appointment, please call your pharmacy*   Lab Work:  None Ordered  If you have labs (blood work) drawn today and your tests are completely normal, you will receive your results only by: MyChart Message (if you have MyChart) OR A paper copy in the mail If you have any lab test  that is abnormal or we need to change your treatment, we will call you to review the results.   Testing/Procedures:  Your physician has requested that you have an echocardiogram - in 3 weeks or later. Echocardiography is a painless test that uses sound waves to create images of your heart. It provides your doctor with information about the size and shape of your heart and how well your heart's chambers and valves are working. This procedure takes approximately one hour. There are no restrictions for this procedure. Please do NOT wear cologne, perfume, aftershave, or lotions (deodorant is allowed). Please arrive 15 minutes prior to your appointment time.  Please note: We ask at that you not bring children with you during ultrasound (echo/ vascular) testing. Due to room size and safety concerns, children are not allowed in the ultrasound rooms during exams. Our front office staff cannot provide observation of children in our lobby area while testing is being conducted. An adult accompanying a patient to their appointment will only be allowed in the ultrasound room at the discretion of the ultrasound technician under special circumstances. We apologize for any  inconvenience.  Your physician has recommended that you wear a Zio monitor.   This monitor is a medical device that records the heart's electrical activity. Doctors most often use these monitors to diagnose arrhythmias. Arrhythmias are problems with the speed or rhythm of the heartbeat. The monitor is a small device applied to your chest. You can wear one while you do your normal daily activities. While wearing this monitor if you have any symptoms to push the button and record what you felt. Once you have worn this monitor for the period of time provider prescribed (Usually 14 days), you will return the monitor device in the postage paid box. Once it is returned they will download the data collected and provide [place] with a report which the provider will then review and we will call you with those results. Important tips:  Avoid showering during the first 24 hours of wearing the monitor. Avoid excessive sweating to help maximize wear time. Do not submerge the device, no hot tubs, and no swimming pools. Keep any lotions or oils away from the patch. After 24 hours you may shower with the patch on. Take brief showers with your back facing the shower head.  Do not remove patch once it has been placed because that will interrupt data and decrease adhesive wear time. Push the button when you have any symptoms and write down what you were feeling. Once you have completed wearing your monitor, remove and place into box which has postage paid and place in your outgoing mailbox.  If for some reason you have misplaced your box then call our office and we can provide another box and/or mail it off for you.     Follow-Up: At [facility], you and your health needs are our priority.  As part of our continuing mission to provide you with exceptional heart care, we have created designated Provider Care Teams.  These Care Teams include your primary Cardiologist (physician) and Advanced Practice Providers  (APPs -  Physician Assistants and Nurse Practitioners) who all work together to provide you with the care you need, when you need it.  We recommend signing up for the patient portal called "MyChart".  Sign up information is provided on this After Visit Summary.  MyChart is used to connect with patients for Virtual Visits (Telemedicine).  Patients are able to view lab/test results,  encounter notes, upcoming appointments, etc.  Non-urgent messages can be sent to your provider as well.   To learn more about what you can do with MyChart, go to [email or web address].    Your next appointment:   8 - 10 week(s)  Provider:   You may see Debbe Odea, MD or one of the following Advanced Practice Providers on your designated Care Team:   Nicolasa Ducking, NP Eula Listen, PA-C Cadence Fransico Michael, PA-C Charlsie Quest, NP Carlos Levering, NP   Signed, Debbe Odea, MD  01/17/2023 10:13 AM    [facility]

## 2023-01-18 DIAGNOSIS — R6889 Other general symptoms and signs: Secondary | ICD-10-CM | POA: Diagnosis not present

## 2023-01-19 LAB — TSH: TSH: 1.1 u[IU]/mL (ref 0.450–4.500)

## 2023-01-25 DIAGNOSIS — R55 Syncope and collapse: Secondary | ICD-10-CM | POA: Diagnosis not present

## 2023-01-29 DIAGNOSIS — Z419 Encounter for procedure for purposes other than remedying health state, unspecified: Secondary | ICD-10-CM | POA: Diagnosis not present

## 2023-01-31 ENCOUNTER — Ambulatory Visit: Payer: Medicaid Other | Admitting: Physician Assistant

## 2023-02-07 ENCOUNTER — Ambulatory Visit: Payer: BC Managed Care – PPO | Attending: Cardiology

## 2023-02-07 DIAGNOSIS — R55 Syncope and collapse: Secondary | ICD-10-CM

## 2023-02-07 LAB — ECHOCARDIOGRAM COMPLETE
AR max vel: 2.68 cm2
AV Area VTI: 2.6 cm2
AV Area mean vel: 2.63 cm2
AV Mean grad: 4 mm[Hg]
AV Peak grad: 6.8 mm[Hg]
Ao pk vel: 1.3 m/s
Area-P 1/2: 4.31 cm2
Calc EF: 57.6 %
S' Lateral: 3.3 cm
Single Plane A2C EF: 58.9 %
Single Plane A4C EF: 56.2 %

## 2023-02-14 ENCOUNTER — Encounter: Payer: Medicaid Other | Admitting: Physician Assistant

## 2023-02-19 ENCOUNTER — Encounter: Payer: Self-pay | Admitting: Cardiology

## 2023-02-23 NOTE — Progress Notes (Signed)
PCP:  Remo Lipps, PA   Chief Complaint  Patient presents with   Gynecologic Exam    Severe cramping during cycles that pt passes out x couple of months.     HPI:      Cindy Cunningham is a 27 y.o. G0P0000 whose LMP was Patient's last menstrual period was 02/10/2023 (exact date)., presents today for her NP annual examination.  Her menses are regular every 28-30 days, lasting 5 days, mod to heavy flow, with nickel sized clots, no BTB.  Has significant dysmen, slightly improved with tylenol/midol/heating pad. Has had syncope due to pain in past. Dysmen worsening over the years. No non-menstrual pain. No FH endometriosis/no prior GYN u/s.   Sex activity: single partner, contraception - none. No pain/bleeding/dryness. Did OCPs in past but couldn't remember every day. No hx of HTN, DVTs, migraines with aura. Doesn't want pregnancy currently.  Last Pap: never Hx of STDs: none  There is a FH of breast cancer in her mat cousin, recent dx; unsure if genetic testing done (pt doesn't qualify). There is no FH of ovarian cancer. The patient does not do self-breast exams.  Tobacco use: vapes daily, trying to quit Alcohol use: social drinker No drug use.  Exercise: mod active  She does get adequate calcium but not Vitamin D in her diet.  Patient Active Problem List   Diagnosis Date Noted   Acute appendicitis 01/11/2018   Trigger finger, acquired 04/30/2013    Past Surgical History:  Procedure Laterality Date   APPENDECTOMY     HAND SURGERY Right     Family History  Problem Relation Age of Onset   Hypertension Mother    Heart disease Mother    Heart disease Father    Hypertension Maternal Grandmother    Hypertension Maternal Grandfather    Diabetes Maternal Grandfather    Hypertension Paternal Grandmother    Diabetes Paternal Grandmother    Hypertension Paternal Grandfather    Breast cancer Cousin 33       Stage 4    Social History   Socioeconomic History    Marital status: Single    Spouse name: Not on file   Number of children: 0   Years of education: Not on file   Highest education level: Not on file  Occupational History   Not on file  Tobacco Use   Smoking status: Never    Passive exposure: Never   Smokeless tobacco: Never  Vaping Use   Vaping status: Former   Substances: Nicotine, Flavoring  Substance and Sexual Activity   Alcohol use: Yes    Comment: occ   Drug use: Not Currently    Types: Marijuana    Comment: twice a month   Sexual activity: Yes    Birth control/protection: None  Other Topics Concern   Not on file  Social History Narrative   Not on file   Social Drivers of Health   Financial Resource Strain: Low Risk  (01/11/2018)   Received from [facility]   Overall Financial Resource Strain (CARDIA)    Difficulty of Paying Living Expenses: Not hard at all  Food Insecurity: Unknown (01/11/2018)   Received from [facility], [facility]   Hunger Vital Sign    Worried About Running Out of Food in the Last Year: Patient declined    Ran Out of Food in the Last Year: Patient declined  Transportation Needs: Unknown (01/11/2018)   Received from [facility], [facility]   [facility] - Transportation    Lack of Transportation (Medical): Patient declined    Lack of Transportation (Non-Medical): Patient declined  Physical Activity: Not on file  Stress: Not on file  Social Connections: Not on file  Intimate Partner Violence: Not At Risk (11/29/2021)   Humiliation, Afraid, Rape, and Kick questionnaire    Fear of Current or Ex-Partner: No    Emotionally Abused: No    Physically Abused: No    Sexually Abused: No    No current outpatient medications on file.     ROS:  Review of Systems  Constitutional:  Negative for fatigue, fever and unexpected weight change.  Respiratory:  Negative for cough, shortness of breath and wheezing.   Cardiovascular:  Negative for chest pain,  palpitations and leg swelling.  Gastrointestinal:  Negative for blood in stool, constipation, diarrhea, nausea and vomiting.  Endocrine: Negative for cold intolerance, heat intolerance and polyuria.  Genitourinary:  Positive for menstrual problem. Negative for dyspareunia, dysuria, flank pain, frequency, genital sores, hematuria, pelvic pain, urgency, vaginal bleeding, vaginal discharge and vaginal pain.  Musculoskeletal:  Negative for back pain, joint swelling and myalgias.  Skin:  Negative for rash.  Neurological:  Negative for dizziness, syncope, light-headedness, numbness and headaches.  Hematological:  Negative for adenopathy.  Psychiatric/Behavioral:  Negative for agitation, confusion, sleep disturbance and suicidal ideas. The patient is not nervous/anxious.    BREAST: No symptoms   Objective: BP 104/66   Ht 5\' 3"  (1.6 m)   Wt 169 lb (76.7 kg)   LMP 02/10/2023 (Exact Date)   BMI 29.94 kg/m    Physical Exam Constitutional:      Appearance: She is well-developed.  Genitourinary:     Vulva normal.     Right Labia: No rash, tenderness or lesions.    Left Labia: No tenderness, lesions or rash.    No vaginal discharge, erythema or tenderness.      Right Adnexa: not tender and no mass present.    Left Adnexa: not tender and no mass present.    No cervical friability or polyp.     Uterus is not enlarged or tender.  Breasts:    Right: No mass, nipple discharge, skin change or tenderness.     Left: No mass, nipple discharge, skin change or tenderness.  Neck:     Thyroid: No thyromegaly.  Cardiovascular:     Rate and Rhythm: Normal rate and regular rhythm.     Heart sounds: Normal heart sounds. No murmur heard. Pulmonary:     Effort: Pulmonary effort is normal.     Breath sounds: Normal breath sounds.  Abdominal:     Palpations: Abdomen is soft.     Tenderness: There is no abdominal tenderness. There is no guarding or rebound.  Musculoskeletal:        General: Normal  range of motion.     Cervical back: Normal range of motion.  Lymphadenopathy:     Cervical: No cervical adenopathy.  Neurological:     General: No focal deficit present.     Mental Status: She is alert and oriented to person, place, and time.     Cranial Nerves: No cranial nerve deficit.  Skin:    General: Skin is warm and dry.  Psychiatric:        Mood and Affect: Mood normal.        Behavior: Behavior normal.        Thought Content: Thought content normal.  Judgment: Judgment normal.  Vitals reviewed.    Assessment/Plan: Encounter for annual routine gynecological examination  Cervical cancer screening - Plan: Cytology - PAP  Screening for STD (sexually transmitted disease) - Plan: Cytology - PAP  Dysmenorrhea - Plan: US PELVIS TRANSVAGINAL NON-OB (TV ONLY); sx worsening past few yrs; rule out STDs, check GYN u/s, neg exam. Discussed BC options, pt to consider. Doesn't want daily method. Will f/u with results and mgmt options.           GYN counsel adequate intake of calcium and vitamin D, diet and exercise     F/U  Return in about 2 weeks (around 03/10/2023) for GYN u/s for dysmen--[facility] to call pt.  Sibyl Mikula B. Krystalynn Ridgeway, PA-C 02/24/2023 11:31 AM

## 2023-02-24 ENCOUNTER — Other Ambulatory Visit (HOSPITAL_COMMUNITY)
Admission: RE | Admit: 2023-02-24 | Discharge: 2023-02-24 | Disposition: A | Discharge status: Home / Self Care | Payer: BC Managed Care – PPO | Source: Ambulatory Visit | Attending: Obstetrics and Gynecology | Admitting: Obstetrics and Gynecology

## 2023-02-24 ENCOUNTER — Encounter: Payer: Self-pay | Admitting: Obstetrics and Gynecology

## 2023-02-24 ENCOUNTER — Ambulatory Visit (INDEPENDENT_AMBULATORY_CARE_PROVIDER_SITE_OTHER): Payer: BC Managed Care – PPO | Admitting: Obstetrics and Gynecology

## 2023-02-24 VITALS — BP 104/66 | Ht 63.0 in | Wt 169.0 lb

## 2023-02-24 DIAGNOSIS — Z124 Encounter for screening for malignant neoplasm of cervix: Secondary | ICD-10-CM | POA: Diagnosis not present

## 2023-02-24 DIAGNOSIS — Z113 Encounter for screening for infections with a predominantly sexual mode of transmission: Secondary | ICD-10-CM | POA: Insufficient documentation

## 2023-02-24 DIAGNOSIS — Z01419 Encounter for gynecological examination (general) (routine) without abnormal findings: Secondary | ICD-10-CM | POA: Diagnosis not present

## 2023-02-24 DIAGNOSIS — N946 Dysmenorrhea, unspecified: Secondary | ICD-10-CM

## 2023-02-24 NOTE — Patient Instructions (Signed)
I value your feedback and you entrusting us with your care. If you get a [facility] patient survey, I would appreciate you taking the time to let us know about your experience today. Thank you! ? ? ?

## 2023-02-27 LAB — CYTOLOGY - PAP
Chlamydia: NEGATIVE
Comment: NEGATIVE
Comment: NORMAL
Diagnosis: NEGATIVE
Neisseria Gonorrhea: NEGATIVE

## 2023-03-01 DIAGNOSIS — Z419 Encounter for procedure for purposes other than remedying health state, unspecified: Secondary | ICD-10-CM | POA: Diagnosis not present

## 2023-03-12 NOTE — Progress Notes (Deleted)
   Cardiology Clinic Note   Date: 03/12/2023 ID: Cindy Cunningham, DOB 09-03-1995, MRN [id number]  Primary Cardiologist:  Redell Cave, MD  Patient Profile    Cindy Cunningham is a 28 y.o. female who presents to the clinic today for ***    Past medical history significant for: Near syncope. Echo 02/07/2023: EF 60 to 65%.  No RWMA.  Normal diastolic parameters.  Normal RV size/function.  No significant valvular abnormalities. Bradycardia. 14-day ZIO 03/03/2023: HR 42-159 bpm, average 78 bpm.  No A-fib/a-flutter.  No significant or sustained arrhythmias.  Rare ectopy.  In summary, patient was first evaluated by Dr. Cave on 01/17/2023 for near syncope and bradycardia at the request of Venetia Motto, NP.  Patient reported an episode of dizziness and nausea while at the grocery store causing her to call her mom to take her to the ED.  She underwent an unremarkable workup with EKG showing sinus arrhythmia 59 bpm.  She reported significant pain at that time from her menses.  She did not fully pass out.  She reports previous similar episodes for at least 7 years sometimes associated with her menses.  She was evaluated with echo and 14-day ZIO as detailed above.     History of Present Illness    Cindy Cunningham is followed by Dr. Cave for the above outlined history.   Today, patient ***  Near syncope Echo December 2024 showed normal LV/RV function with no significant valvular abnormalities.  Patient***  Bradycardia 14-day ZIO January 2025 showed HR 42-159, average 78 bpm, rare ectopy, no sustained arrhythmias.  Patient*** -Continue to monitor symptoms.   ROS: All other systems reviewed and are otherwise negative except as noted in History of Present Illness.  EKGs/Labs Reviewed        11/29/2022: ALT 17; AST 15; BUN 9; Creatinine, Ser 0.76; Potassium 3.8; Sodium 137   11/29/2022: Hemoglobin 14.0; WBC 8.9   01/18/2023: TSH 1.100   No results found for requested labs  within last 365 days.  ***  Risk Assessment/Calculations    {Does this patient have ATRIAL FIBRILLATION?:[phone]} No BP recorded.  {Refresh Note OR Click here to enter BP  :1}***        Physical Exam    VS:  LMP 02/10/2023 (Exact Date)  , BMI There is no height or weight on file to calculate BMI.  GEN: Well nourished, well developed, in no acute distress. Neck: No JVD or carotid bruits. Cardiac: *** RRR. No murmurs. No rubs or gallops.   Respiratory:  Respirations regular and unlabored. Clear to auscultation without rales, wheezing or rhonchi. GI: Soft, nontender, nondistended. Extremities: Radials/DP/PT 2+ and equal bilaterally. No clubbing or cyanosis. No edema ***  Skin: Warm and dry, no rash. Neuro: Strength intact.  Assessment & Plan   ***  Disposition: ***     {Are you ordering a CV Procedure (e.g. stress test, cath, DCCV, TEE, etc)?   Press F2        :[id number]}   Signed, Barnie HERO. Tanessa Tidd, DNP, NP-C

## 2023-03-13 ENCOUNTER — Encounter: Payer: Self-pay | Admitting: Obstetrics and Gynecology

## 2023-03-14 ENCOUNTER — Ambulatory Visit: Payer: BC Managed Care – PPO | Attending: Student | Admitting: Student

## 2023-04-01 DIAGNOSIS — Z419 Encounter for procedure for purposes other than remedying health state, unspecified: Secondary | ICD-10-CM | POA: Diagnosis not present

## 2023-04-29 DIAGNOSIS — Z419 Encounter for procedure for purposes other than remedying health state, unspecified: Secondary | ICD-10-CM | POA: Diagnosis not present

## 2023-06-10 DIAGNOSIS — Z419 Encounter for procedure for purposes other than remedying health state, unspecified: Secondary | ICD-10-CM | POA: Diagnosis not present

## 2023-07-10 DIAGNOSIS — Z419 Encounter for procedure for purposes other than remedying health state, unspecified: Secondary | ICD-10-CM | POA: Diagnosis not present

## 2023-08-10 DIAGNOSIS — Z419 Encounter for procedure for purposes other than remedying health state, unspecified: Secondary | ICD-10-CM | POA: Diagnosis not present

## 2023-09-09 DIAGNOSIS — Z419 Encounter for procedure for purposes other than remedying health state, unspecified: Secondary | ICD-10-CM | POA: Diagnosis not present

## 2023-10-10 DIAGNOSIS — Z419 Encounter for procedure for purposes other than remedying health state, unspecified: Secondary | ICD-10-CM | POA: Diagnosis not present

## 2023-11-10 DIAGNOSIS — Z419 Encounter for procedure for purposes other than remedying health state, unspecified: Secondary | ICD-10-CM | POA: Diagnosis not present
